# Patient Record
Sex: Male | Born: 1940 | Race: Black or African American | Hispanic: No | Marital: Married | Smoking: Former smoker
Health system: Southern US, Community
[De-identification: ages and names within clinical notes are randomized; demographics above are authoritative.]

## PROBLEM LIST (undated history)

## (undated) DIAGNOSIS — F32A Depression, unspecified: Secondary | ICD-10-CM

## (undated) DIAGNOSIS — Z8719 Personal history of other diseases of the digestive system: Secondary | ICD-10-CM

## (undated) DIAGNOSIS — E119 Type 2 diabetes mellitus without complications: Secondary | ICD-10-CM

## (undated) DIAGNOSIS — IMO0001 Reserved for inherently not codable concepts without codable children: Secondary | ICD-10-CM

## (undated) DIAGNOSIS — M1990 Unspecified osteoarthritis, unspecified site: Secondary | ICD-10-CM

## (undated) DIAGNOSIS — F419 Anxiety disorder, unspecified: Secondary | ICD-10-CM

## (undated) DIAGNOSIS — I10 Essential (primary) hypertension: Secondary | ICD-10-CM

## (undated) DIAGNOSIS — F4310 Post-traumatic stress disorder, unspecified: Secondary | ICD-10-CM

## (undated) DIAGNOSIS — I519 Heart disease, unspecified: Secondary | ICD-10-CM

## (undated) DIAGNOSIS — F329 Major depressive disorder, single episode, unspecified: Secondary | ICD-10-CM

## (undated) DIAGNOSIS — K219 Gastro-esophageal reflux disease without esophagitis: Secondary | ICD-10-CM

## (undated) DIAGNOSIS — E1140 Type 2 diabetes mellitus with diabetic neuropathy, unspecified: Secondary | ICD-10-CM

## (undated) HISTORY — PX: HEMORROIDECTOMY: SUR656

## (undated) HISTORY — PX: CHOLECYSTECTOMY: SHX55

---

## 2013-11-08 ENCOUNTER — Encounter (HOSPITAL_COMMUNITY): Payer: Self-pay | Admitting: *Deleted

## 2013-11-14 ENCOUNTER — Other Ambulatory Visit: Payer: Self-pay | Admitting: Gastroenterology

## 2013-11-14 NOTE — Anesthesia Preprocedure Evaluation (Addendum)
Anesthesia Evaluation  Patient identified by MRN, date of birth, ID band Patient awake    Reviewed: Allergy & Precautions, H&P , NPO status , Patient's Chart, lab work & pertinent test results  Airway Mallampati: II  TM Distance: >3 FB Neck ROM: Full    Dental no notable dental hx.    Pulmonary former smoker,  breath sounds clear to auscultation  Pulmonary exam normal       Cardiovascular hypertension, Pt. on medications and Pt. on home beta blockers Rhythm:Regular Rate:Normal     Neuro/Psych PSYCHIATRIC DISORDERS Anxiety Depression negative neurological ROS     GI/Hepatic Neg liver ROS, GERD-  Medicated and Controlled,  Endo/Other  negative endocrine ROSdiabetes  Renal/GU negative Renal ROS  negative genitourinary   Musculoskeletal negative musculoskeletal ROS (+)   Abdominal   Peds negative pediatric ROS (+)  Hematology negative hematology ROS (+)   Anesthesia Other Findings   Reproductive/Obstetrics negative OB ROS                            Anesthesia Physical Anesthesia Plan  ASA: II  Anesthesia Plan: MAC   Post-op Pain Management:    Induction: Intravenous  Airway Management Planned: Nasal Cannula  Additional Equipment:   Intra-op Plan:   Post-operative Plan: Extubation in OR  Informed Consent: I have reviewed the patients History and Physical, chart, labs and discussed the procedure including the risks, benefits and alternatives for the proposed anesthesia with the patient or authorized representative who has indicated his/her understanding and acceptance.   Dental advisory given  Plan Discussed with: CRNA  Anesthesia Plan Comments:         Anesthesia Quick Evaluation

## 2013-11-15 ENCOUNTER — Ambulatory Visit (HOSPITAL_COMMUNITY): Payer: Non-veteran care | Admitting: Anesthesiology

## 2013-11-15 ENCOUNTER — Encounter (HOSPITAL_COMMUNITY): Payer: Self-pay

## 2013-11-15 ENCOUNTER — Encounter (HOSPITAL_COMMUNITY)
Admission: RE | Disposition: A | Discharge status: Home / Self Care | Payer: Self-pay | Source: Ambulatory Visit | Attending: Gastroenterology

## 2013-11-15 ENCOUNTER — Ambulatory Visit (HOSPITAL_COMMUNITY)
Admission: RE | Admit: 2013-11-15 | Discharge: 2013-11-15 | Disposition: A | Discharge status: Home / Self Care | Payer: Non-veteran care | Source: Ambulatory Visit | Attending: Gastroenterology | Admitting: Gastroenterology

## 2013-11-15 DIAGNOSIS — D131 Benign neoplasm of stomach: Secondary | ICD-10-CM | POA: Diagnosis not present

## 2013-11-15 DIAGNOSIS — K319 Disease of stomach and duodenum, unspecified: Secondary | ICD-10-CM | POA: Diagnosis present

## 2013-11-15 HISTORY — PX: EUS: SHX5427

## 2013-11-15 HISTORY — DX: Personal history of other diseases of the digestive system: Z87.19

## 2013-11-15 HISTORY — PX: FINE NEEDLE ASPIRATION: SHX5430

## 2013-11-15 HISTORY — DX: Type 2 diabetes mellitus without complications: E11.9

## 2013-11-15 HISTORY — DX: Anxiety disorder, unspecified: F41.9

## 2013-11-15 HISTORY — DX: Heart disease, unspecified: I51.9

## 2013-11-15 HISTORY — DX: Post-traumatic stress disorder, unspecified: F43.10

## 2013-11-15 HISTORY — DX: Reserved for inherently not codable concepts without codable children: IMO0001

## 2013-11-15 HISTORY — DX: Essential (primary) hypertension: I10

## 2013-11-15 HISTORY — DX: Major depressive disorder, single episode, unspecified: F32.9

## 2013-11-15 HISTORY — DX: Gastro-esophageal reflux disease without esophagitis: K21.9

## 2013-11-15 HISTORY — DX: Depression, unspecified: F32.A

## 2013-11-15 HISTORY — DX: Unspecified osteoarthritis, unspecified site: M19.90

## 2013-11-15 LAB — GLUCOSE, CAPILLARY: Glucose-Capillary: 81 mg/dL (ref 70–99)

## 2013-11-15 SURGERY — ESOPHAGEAL ENDOSCOPIC ULTRASOUND (EUS) RADIAL
Anesthesia: Monitor Anesthesia Care

## 2013-11-15 MED ORDER — SODIUM CHLORIDE 0.9 % IV SOLN: INTRAVENOUS | Status: DC

## 2013-11-15 MED ORDER — PROPOFOL 10 MG/ML IV BOLUS
INTRAVENOUS | Status: DC | PRN
  Administered 2013-11-15: 50 mg via INTRAVENOUS

## 2013-11-15 MED ORDER — LIDOCAINE HCL (CARDIAC) 20 MG/ML IV SOLN
INTRAVENOUS | Status: AC
  Filled 2013-11-15: qty 5

## 2013-11-15 MED ORDER — PROPOFOL 10 MG/ML IV BOLUS
INTRAVENOUS | Status: AC
  Filled 2013-11-15: qty 20

## 2013-11-15 MED ORDER — LACTATED RINGERS IV SOLN
INTRAVENOUS | Status: DC
  Administered 2013-11-15: 1000 mL via INTRAVENOUS

## 2013-11-15 MED ORDER — PROPOFOL INFUSION 10 MG/ML OPTIME
INTRAVENOUS | Status: DC | PRN
  Administered 2013-11-15: 140 ug/kg/min via INTRAVENOUS

## 2013-11-15 MED ORDER — LIDOCAINE HCL (CARDIAC) 20 MG/ML IV SOLN
INTRAVENOUS | Status: DC | PRN
  Administered 2013-11-15: 50 mg via INTRAVENOUS

## 2013-11-15 MED ORDER — LACTATED RINGERS IV SOLN
INTRAVENOUS | Status: DC | PRN
  Administered 2013-11-15: 12:00:00 via INTRAVENOUS

## 2013-11-15 NOTE — Addendum Note (Signed)
Addended by: Arta Silence on: 11/15/2013 09:29 AM   Modules accepted: Orders

## 2013-11-15 NOTE — Anesthesia Postprocedure Evaluation (Signed)
  Anesthesia Post-op Note  Patient: Gregory Mueller  Procedure(s) Performed: Procedure(s) (LRB): ESOPHAGEAL ENDOSCOPIC ULTRASOUND (EUS) RADIAL (N/A) FINE NEEDLE ASPIRATION (FNA) RADIAL (N/A)  Patient Location: PACU  Anesthesia Type: MAC  Level of Consciousness: awake and alert   Airway and Oxygen Therapy: Patient Spontanous Breathing  Post-op Pain: mild  Post-op Assessment: Post-op Vital signs reviewed, Patient's Cardiovascular Status Stable, Respiratory Function Stable, Patent Airway and No signs of Nausea or vomiting  Last Vitals:  Filed Vitals:   11/15/13 1320  BP: 148/66  Pulse: 43  Temp:   Resp: 13    Post-op Vital Signs: stable   Complications: No apparent anesthesia complications

## 2013-11-15 NOTE — Op Note (Signed)
[facility] [place], [id number]   ENDOSCOPIC ULTRASOUND PROCEDURE REPORT  PATIENT: Gregory Mueller, Gregory Mueller  MR#: [id number] BIRTHDATE: 12/31/1940  GENDER: male ENDOSCOPIST: Arta Silence, MD REFERRED BY:  [facility] PROCEDURE DATE:  11/15/2013 PROCEDURE:   Upper EUS ASA CLASS:      Class II INDICATIONS:   1.  gastric nodule. MEDICATIONS: Monitored anesthesia care  DESCRIPTION OF PROCEDURE:   After the risks benefits and alternatives of the procedure were  explained, informed consent was obtained. The patient was then placed in the left, lateral, decubitus postion and IV sedation was administered. Throughout the procedure, the patients blood pressure, pulse and oxygen saturations were monitored continuously.  Under direct visualization, the     endoscope was introduced through the mouth and advanced to the second portion of the duodenum .  Water was used as necessary to provide an acoustic interface.  Upon completion of the imaging, water was removed and the patient was sent to the recovery room in satisfactory condition.    FINDINGS:      EGD:  Normal esophagus.  Few very small gastric polyps.  In proximal stomach, along lesser curvature of the cardia, a very small submucosal-appearing nodule was seen with normal overlying mucosa.  Mild antral gastritis.  Endoscopy otherwise normal to the second portion of the duodenum. EUS:  Small 7mm x 36mm hypoechoic nodule seen arising from the muscularis propria along the proximal stomach, directly apposing the liver.  No other abnormalities seen within the gastric wall. No perigastric adenopathy.  No abnormal gastric wall layers identified.  IMPRESSION:     As above.  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  RECOMMENDATIONS:     1.  Watch for potential complications of procedure. 2.  Given very small size and indolent appearance on endoscopic ultrasound, could consider repeat EGD +/- EUS in a  couple years. Unless lesion approaches 2cm in size, doubt there is any utility in obtaining biopsies or considering surgical resection. 3.  Follow-up with [facility] on as-needed basis.   _______________________________ Lorrin MaisArta Silence, MD 11/15/2013 1:00 PM   CC:

## 2013-11-15 NOTE — Transfer of Care (Signed)
Immediate Anesthesia Transfer of Care Note  Patient: Gregory Mueller  Procedure(s) Performed: Procedure(s): ESOPHAGEAL ENDOSCOPIC ULTRASOUND (EUS) RADIAL (N/A) FINE NEEDLE ASPIRATION (FNA) RADIAL (N/A)  Patient Location: PACU  Anesthesia Type:MAC  Level of Consciousness: awake, alert  and oriented  Airway & Oxygen Therapy: Patient Spontanous Breathing and Patient connected to nasal cannula oxygen  Post-op Assessment: Report given to PACU RN and Post -op Vital signs reviewed and stable  Post vital signs: stable  Complications: No apparent anesthesia complications

## 2013-11-15 NOTE — H&P (Signed)
Patient interval history reviewed.  Patient examined again.  There has been no change from documented H/P dated 11/07/13 (scanned into chart from our office) except as documented above.  Assessment:  1.  Gastric nodule.  Plan:  1.  Upper endoscopic ultrasound with possible fine needle aspiration biopsies. 2.  Risks (bleeding, infection, bowel perforation that could require surgery, sedation-related changes in cardiopulmonary systems), benefits (identification and possible treatment of source of symptoms, exclusion of certain causes of symptoms), and alternatives (watchful waiting, radiographic imaging studies, empiric medical treatment) of upper endoscopy with ultrasound and possible biopsies (EUS +/- FNA) were explained to patient/family in detail and patient wishes to proceed.

## 2013-11-16 ENCOUNTER — Encounter (HOSPITAL_COMMUNITY): Payer: Self-pay | Admitting: Gastroenterology

## 2015-02-27 DIAGNOSIS — K219 Gastro-esophageal reflux disease without esophagitis: Secondary | ICD-10-CM | POA: Diagnosis not present

## 2015-03-06 DIAGNOSIS — M9903 Segmental and somatic dysfunction of lumbar region: Secondary | ICD-10-CM | POA: Diagnosis not present

## 2015-03-06 DIAGNOSIS — M5442 Lumbago with sciatica, left side: Secondary | ICD-10-CM | POA: Diagnosis not present

## 2015-03-08 DIAGNOSIS — M9903 Segmental and somatic dysfunction of lumbar region: Secondary | ICD-10-CM | POA: Diagnosis not present

## 2015-03-08 DIAGNOSIS — M5442 Lumbago with sciatica, left side: Secondary | ICD-10-CM | POA: Diagnosis not present

## 2015-03-11 DIAGNOSIS — K219 Gastro-esophageal reflux disease without esophagitis: Secondary | ICD-10-CM | POA: Diagnosis not present

## 2015-03-11 DIAGNOSIS — Z1211 Encounter for screening for malignant neoplasm of colon: Secondary | ICD-10-CM | POA: Diagnosis not present

## 2015-03-11 DIAGNOSIS — R1310 Dysphagia, unspecified: Secondary | ICD-10-CM | POA: Diagnosis not present

## 2015-03-11 DIAGNOSIS — K5900 Constipation, unspecified: Secondary | ICD-10-CM | POA: Diagnosis not present

## 2015-03-12 DIAGNOSIS — K2970 Gastritis, unspecified, without bleeding: Secondary | ICD-10-CM | POA: Diagnosis not present

## 2015-03-12 DIAGNOSIS — K5900 Constipation, unspecified: Secondary | ICD-10-CM | POA: Diagnosis not present

## 2015-03-12 DIAGNOSIS — R1310 Dysphagia, unspecified: Secondary | ICD-10-CM | POA: Diagnosis not present

## 2015-03-12 DIAGNOSIS — K209 Esophagitis, unspecified: Secondary | ICD-10-CM | POA: Diagnosis not present

## 2015-03-15 DIAGNOSIS — R1030 Lower abdominal pain, unspecified: Secondary | ICD-10-CM | POA: Diagnosis not present

## 2015-03-20 DIAGNOSIS — R1310 Dysphagia, unspecified: Secondary | ICD-10-CM | POA: Diagnosis not present

## 2015-03-20 DIAGNOSIS — K219 Gastro-esophageal reflux disease without esophagitis: Secondary | ICD-10-CM | POA: Diagnosis not present

## 2015-03-20 DIAGNOSIS — K5731 Diverticulosis of large intestine without perforation or abscess with bleeding: Secondary | ICD-10-CM | POA: Diagnosis not present

## 2015-03-20 DIAGNOSIS — K2970 Gastritis, unspecified, without bleeding: Secondary | ICD-10-CM | POA: Diagnosis not present

## 2015-10-04 DIAGNOSIS — M9901 Segmental and somatic dysfunction of cervical region: Secondary | ICD-10-CM | POA: Diagnosis not present

## 2015-10-04 DIAGNOSIS — M47812 Spondylosis without myelopathy or radiculopathy, cervical region: Secondary | ICD-10-CM | POA: Diagnosis not present

## 2017-03-22 ENCOUNTER — Other Ambulatory Visit: Payer: Self-pay

## 2017-03-22 ENCOUNTER — Encounter (HOSPITAL_COMMUNITY): Payer: Self-pay | Admitting: *Deleted

## 2017-03-22 ENCOUNTER — Emergency Department (HOSPITAL_COMMUNITY)
Admission: EM | Admit: 2017-03-22 | Discharge: 2017-03-22 | Disposition: A | Discharge status: Home / Self Care | Payer: Non-veteran care | Attending: Emergency Medicine | Admitting: Emergency Medicine

## 2017-03-22 ENCOUNTER — Emergency Department (HOSPITAL_COMMUNITY): Payer: Non-veteran care

## 2017-03-22 DIAGNOSIS — G4453 Primary thunderclap headache: Secondary | ICD-10-CM | POA: Diagnosis not present

## 2017-03-22 DIAGNOSIS — E237 Disorder of pituitary gland, unspecified: Secondary | ICD-10-CM | POA: Diagnosis not present

## 2017-03-22 DIAGNOSIS — Z79899 Other long term (current) drug therapy: Secondary | ICD-10-CM | POA: Diagnosis not present

## 2017-03-22 DIAGNOSIS — R51 Headache: Secondary | ICD-10-CM | POA: Diagnosis present

## 2017-03-22 DIAGNOSIS — G44209 Tension-type headache, unspecified, not intractable: Secondary | ICD-10-CM | POA: Diagnosis not present

## 2017-03-22 DIAGNOSIS — E236 Other disorders of pituitary gland: Secondary | ICD-10-CM

## 2017-03-22 DIAGNOSIS — E119 Type 2 diabetes mellitus without complications: Secondary | ICD-10-CM | POA: Insufficient documentation

## 2017-03-22 DIAGNOSIS — Z7982 Long term (current) use of aspirin: Secondary | ICD-10-CM | POA: Insufficient documentation

## 2017-03-22 DIAGNOSIS — I10 Essential (primary) hypertension: Secondary | ICD-10-CM | POA: Diagnosis not present

## 2017-03-22 DIAGNOSIS — Z87891 Personal history of nicotine dependence: Secondary | ICD-10-CM | POA: Diagnosis not present

## 2017-03-22 HISTORY — DX: Type 2 diabetes mellitus with diabetic neuropathy, unspecified: E11.40

## 2017-03-22 LAB — COMPREHENSIVE METABOLIC PANEL
ALT: 8 U/L — ABNORMAL LOW (ref 17–63)
AST: 20 U/L (ref 15–41)
Albumin: 3.5 g/dL (ref 3.5–5.0)
Alkaline Phosphatase: 58 U/L (ref 38–126)
Anion gap: 7 (ref 5–15)
BILIRUBIN TOTAL: 0.5 mg/dL (ref 0.3–1.2)
BUN: 11 mg/dL (ref 6–20)
CO2: 23 mmol/L (ref 22–32)
Calcium: 9.4 mg/dL (ref 8.9–10.3)
Chloride: 107 mmol/L (ref 101–111)
Creatinine, Ser: 1.13 mg/dL (ref 0.61–1.24)
GFR calc Af Amer: >60 mL/min (ref >60)
GFR calc non Af Amer: >60 mL/min (ref >60)
GLUCOSE: 91 mg/dL (ref 65–99)
POTASSIUM: 4.2 mmol/L (ref 3.5–5.1)
Sodium: 137 mmol/L (ref 135–145)
TOTAL PROTEIN: 6.4 g/dL — AB (ref 6.5–8.1)

## 2017-03-22 LAB — CBC
HCT: 34.5 % — ABNORMAL LOW (ref 39.0–52.0)
Hemoglobin: 11.3 g/dL — ABNORMAL LOW (ref 13.0–17.0)
MCH: 28.5 pg (ref 26.0–34.0)
MCHC: 32.8 g/dL (ref 30.0–36.0)
MCV: 87.1 fL (ref 78.0–100.0)
Platelets: DECREASED 10*3/uL (ref 150–400)
RBC: 3.96 MIL/uL — ABNORMAL LOW (ref 4.22–5.81)
RDW: 16.8 % — AB (ref 11.5–15.5)
WBC: 6.5 10*3/uL (ref 4.0–10.5)

## 2017-03-22 LAB — PLATELET COUNT: PLATELETS: 144 10*3/uL — AB (ref 150–400)

## 2017-03-22 MED ORDER — SODIUM CHLORIDE 0.9 % IV BOLUS (SEPSIS)
500.0000 mL | Freq: Once | INTRAVENOUS | Status: AC
  Administered 2017-03-22: 500 mL via INTRAVENOUS

## 2017-03-22 MED ORDER — OXYCODONE-ACETAMINOPHEN 5-325 MG PO TABS: 1.0000 | ORAL_TABLET | Freq: Once | ORAL | Status: DC

## 2017-03-22 MED ORDER — DIAZEPAM 5 MG/ML IJ SOLN
5.0000 mg | Freq: Once | INTRAMUSCULAR | Status: AC
  Administered 2017-03-22: 5 mg via INTRAVENOUS
  Filled 2017-03-22: qty 2

## 2017-03-22 MED ORDER — MORPHINE SULFATE (PF) 4 MG/ML IV SOLN
4.0000 mg | Freq: Once | INTRAVENOUS | Status: AC
  Administered 2017-03-22: 4 mg via INTRAVENOUS
  Filled 2017-03-22: qty 1

## 2017-03-22 MED ORDER — PROCHLORPERAZINE EDISYLATE 5 MG/ML IJ SOLN
10.0000 mg | Freq: Once | INTRAMUSCULAR | Status: AC
  Administered 2017-03-22: 10 mg via INTRAVENOUS
  Filled 2017-03-22: qty 2

## 2017-03-22 MED ORDER — KETOROLAC TROMETHAMINE 15 MG/ML IJ SOLN: 15.0000 mg | Freq: Once | INTRAMUSCULAR | Status: DC

## 2017-03-22 NOTE — ED Notes (Signed)
Pt returned from imaging.

## 2017-03-22 NOTE — ED Notes (Signed)
Report pain in head and neck. He reports that he was given pain Medicine at [facility] which helped but back in pain

## 2017-03-22 NOTE — ED Notes (Signed)
Patient's wife reports that they were at the [place] on Saturday, then on Sunday, they went to another hospital. She states that they were told that he has a "Tumor and needs to get an MRI".  EDP at bedside

## 2017-03-22 NOTE — ED Triage Notes (Signed)
Pt was tx at [facility] last night and dx with a tension headache.  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  Pt is not nauseated, not photophobic and is afebrile.  Pt was given a narcotic at the hospital, but it has worn off and the pain is unbearable.

## 2017-03-22 NOTE — ED Notes (Signed)
ED Provider at bedside. 

## 2017-03-22 NOTE — ED Provider Notes (Signed)
Care assumed from previous provider PA Wetzel. Please see note for further details. Case discussed, plan agreed upon. Briefly, patient is a 77 y.o. male who presents to ED for persistent headache over the last 5 days. Seen at [facility] yesterday where CT head was obtained showing pituitary mass. Per previous provider, he was informed to follow up for MRI for further evaluation. MRI ordered in ED today and pending at shift change. Will follow up on results and disposition accordingly. If no acute findings, likely can follow up as outpatient.   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    Patient reevaluated.  He has not had any visual changes.  Headache has improved with medications here in ED.  Evaluation does not show pathology that would require ongoing emergent intervention or inpatient treatment. He does have a prescription for pain medication from ER visit yesterday.  I discussed MRI findings with patient and family as well as the importance of neurology follow-up which was provided today.  We discussed reasons to return to the emergency department and symptomatic home care instructions.  All questions were answered.  Patient discussed with Dr. Wilson Singer who agrees with treatment plan.     Illyana Schorsch, Ozella Almond, PA-C 03/22/17 2154    Virgel Manifold, MD 03/23/17 [phone]

## 2017-03-22 NOTE — ED Notes (Signed)
Patient transported to MRI 
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
## 2017-03-22 NOTE — ED Notes (Signed)
Family remains at bedside.

## 2017-03-22 NOTE — Discharge Instructions (Signed)
Please call the neurology clinic listed in the morning to schedule a follow up appointment.   Return to ER for fevers, visual changes, new or worsening symptoms, any additional concerns.

## 2017-03-22 NOTE — ED Notes (Signed)
Iv team at the bedsdie

## 2017-03-22 NOTE — ED Notes (Signed)
Pt still in MRI, family in room awaiting pt return.

## 2017-03-22 NOTE — ED Notes (Signed)
Per lab, metabolic panel grossly hemolyzed. Will recollect.

## 2017-03-22 NOTE — ED Notes (Signed)
Pt in gown and on monitor 

## 2017-03-22 NOTE — ED Notes (Signed)
Patient verbalizes understanding of discharge instructions. Opportunity for questioning and answers were provided. Armband removed by staff, pt discharged from ED via wheelchair.  

## 2017-03-22 NOTE — ED Notes (Signed)
Pt remains in MRI 

## 2017-03-24 ENCOUNTER — Telehealth: Payer: Self-pay | Admitting: Diagnostic Neuroimaging

## 2017-03-24 ENCOUNTER — Encounter: Payer: Self-pay | Admitting: Diagnostic Neuroimaging

## 2017-03-24 ENCOUNTER — Ambulatory Visit (INDEPENDENT_AMBULATORY_CARE_PROVIDER_SITE_OTHER): Payer: Medicare Other | Admitting: Diagnostic Neuroimaging

## 2017-03-24 VITALS — BP 128/81 | HR 62 | Ht 68.0 in | Wt 219.2 lb

## 2017-03-24 DIAGNOSIS — G4489 Other headache syndrome: Secondary | ICD-10-CM | POA: Diagnosis not present

## 2017-03-24 DIAGNOSIS — D352 Benign neoplasm of pituitary gland: Secondary | ICD-10-CM

## 2017-03-24 NOTE — Telephone Encounter (Signed)
Noted  
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
## 2017-03-24 NOTE — Telephone Encounter (Signed)
Called and Left message for [facility] . Relayed I had Urgent referral waiting for a returned telephone call. Neurosurgery [phone] ext [phone]. Endocrinology [phone] ext [phone]. Ophthalmology [phone] ext [phone]    I called and explained to patient that I had called and left a message and explained I would do my best to get him in with [facility] as quick as I could . Patient relayed if they had not called me back buy Monday 03/29/2017. To schedule him with [place] neuro surgery .  Endocrinology and Ophthalmology apt in [place].

## 2017-03-29 NOTE — Telephone Encounter (Signed)
Spoke to Patient his referral has been sent to [facility] Neurosurgery.

## 2017-03-29 NOTE — Telephone Encounter (Signed)
Pt called back, he is wanting to know the status of the referrals. Please call to advise

## 2017-03-30 NOTE — Telephone Encounter (Addendum)
Patient has his Ophthalmology apt 04/01/2017 arrive at 9:30 for 10:00 apt at [facility] center.

## 2017-03-31 DIAGNOSIS — D352 Benign neoplasm of pituitary gland: Secondary | ICD-10-CM | POA: Diagnosis not present

## 2017-03-31 DIAGNOSIS — Z6833 Body mass index (BMI) 33.0-33.9, adult: Secondary | ICD-10-CM | POA: Diagnosis not present

## 2017-03-31 DIAGNOSIS — I10 Essential (primary) hypertension: Secondary | ICD-10-CM | POA: Diagnosis not present

## 2017-03-31 NOTE — Telephone Encounter (Signed)
Patient is scheduled with Neurosurgery 04/16/2017 Rollene Fare stated if they get Cx she will call Patient first. Patient is aware I have talked to him.

## 2017-04-01 DIAGNOSIS — E237 Disorder of pituitary gland, unspecified: Secondary | ICD-10-CM | POA: Diagnosis not present

## 2017-04-09 ENCOUNTER — Ambulatory Visit: Payer: Medicare Other | Admitting: "Endocrinology

## 2017-04-13 DIAGNOSIS — D352 Benign neoplasm of pituitary gland: Secondary | ICD-10-CM | POA: Diagnosis not present

## 2017-04-19 DIAGNOSIS — I10 Essential (primary) hypertension: Secondary | ICD-10-CM | POA: Diagnosis not present

## 2017-04-19 DIAGNOSIS — D352 Benign neoplasm of pituitary gland: Secondary | ICD-10-CM | POA: Diagnosis not present

## 2017-04-19 DIAGNOSIS — Z6837 Body mass index (BMI) 37.0-37.9, adult: Secondary | ICD-10-CM | POA: Diagnosis not present

## 2017-08-11 DIAGNOSIS — I10 Essential (primary) hypertension: Secondary | ICD-10-CM | POA: Diagnosis not present

## 2017-08-11 DIAGNOSIS — F4310 Post-traumatic stress disorder, unspecified: Secondary | ICD-10-CM | POA: Diagnosis not present

## 2017-08-11 DIAGNOSIS — E2740 Unspecified adrenocortical insufficiency: Secondary | ICD-10-CM | POA: Diagnosis not present

## 2017-08-11 DIAGNOSIS — N400 Enlarged prostate without lower urinary tract symptoms: Secondary | ICD-10-CM | POA: Diagnosis not present

## 2017-08-11 DIAGNOSIS — D352 Benign neoplasm of pituitary gland: Secondary | ICD-10-CM | POA: Diagnosis not present

## 2017-08-11 DIAGNOSIS — E230 Hypopituitarism: Secondary | ICD-10-CM | POA: Diagnosis not present

## 2017-08-12 DIAGNOSIS — E039 Hypothyroidism, unspecified: Secondary | ICD-10-CM | POA: Diagnosis present

## 2017-08-12 DIAGNOSIS — Z87891 Personal history of nicotine dependence: Secondary | ICD-10-CM | POA: Diagnosis not present

## 2017-08-12 DIAGNOSIS — N400 Enlarged prostate without lower urinary tract symptoms: Secondary | ICD-10-CM | POA: Diagnosis present

## 2017-08-12 DIAGNOSIS — F4310 Post-traumatic stress disorder, unspecified: Secondary | ICD-10-CM | POA: Diagnosis present

## 2017-08-12 DIAGNOSIS — Z7982 Long term (current) use of aspirin: Secondary | ICD-10-CM | POA: Diagnosis not present

## 2017-08-12 DIAGNOSIS — Z885 Allergy status to narcotic agent status: Secondary | ICD-10-CM | POA: Diagnosis not present

## 2017-08-12 DIAGNOSIS — D352 Benign neoplasm of pituitary gland: Secondary | ICD-10-CM | POA: Diagnosis present

## 2017-08-12 DIAGNOSIS — E1140 Type 2 diabetes mellitus with diabetic neuropathy, unspecified: Secondary | ICD-10-CM | POA: Diagnosis present

## 2017-08-12 DIAGNOSIS — Z79899 Other long term (current) drug therapy: Secondary | ICD-10-CM | POA: Diagnosis not present

## 2017-08-12 DIAGNOSIS — E2740 Unspecified adrenocortical insufficiency: Secondary | ICD-10-CM | POA: Diagnosis present

## 2017-08-12 DIAGNOSIS — I10 Essential (primary) hypertension: Secondary | ICD-10-CM | POA: Diagnosis present

## 2017-08-12 DIAGNOSIS — E230 Hypopituitarism: Secondary | ICD-10-CM | POA: Diagnosis present

## 2017-09-13 DIAGNOSIS — E230 Hypopituitarism: Secondary | ICD-10-CM | POA: Diagnosis not present

## 2017-09-13 DIAGNOSIS — E291 Testicular hypofunction: Secondary | ICD-10-CM | POA: Diagnosis not present

## 2017-09-13 DIAGNOSIS — E559 Vitamin D deficiency, unspecified: Secondary | ICD-10-CM | POA: Diagnosis not present

## 2017-09-13 DIAGNOSIS — E039 Hypothyroidism, unspecified: Secondary | ICD-10-CM | POA: Diagnosis not present

## 2017-09-13 DIAGNOSIS — E1165 Type 2 diabetes mellitus with hyperglycemia: Secondary | ICD-10-CM | POA: Diagnosis not present

## 2017-09-13 DIAGNOSIS — E2740 Unspecified adrenocortical insufficiency: Secondary | ICD-10-CM | POA: Diagnosis not present

## 2017-09-13 DIAGNOSIS — D352 Benign neoplasm of pituitary gland: Secondary | ICD-10-CM | POA: Diagnosis not present

## 2017-10-26 DIAGNOSIS — E039 Hypothyroidism, unspecified: Secondary | ICD-10-CM | POA: Diagnosis not present

## 2017-10-26 DIAGNOSIS — E559 Vitamin D deficiency, unspecified: Secondary | ICD-10-CM | POA: Diagnosis not present

## 2017-10-26 DIAGNOSIS — E291 Testicular hypofunction: Secondary | ICD-10-CM | POA: Diagnosis not present

## 2017-10-26 DIAGNOSIS — E230 Hypopituitarism: Secondary | ICD-10-CM | POA: Diagnosis not present

## 2017-10-26 DIAGNOSIS — E1165 Type 2 diabetes mellitus with hyperglycemia: Secondary | ICD-10-CM | POA: Diagnosis not present

## 2017-12-27 DIAGNOSIS — E291 Testicular hypofunction: Secondary | ICD-10-CM | POA: Diagnosis not present

## 2017-12-27 DIAGNOSIS — E039 Hypothyroidism, unspecified: Secondary | ICD-10-CM | POA: Diagnosis not present

## 2017-12-27 DIAGNOSIS — E1165 Type 2 diabetes mellitus with hyperglycemia: Secondary | ICD-10-CM | POA: Diagnosis not present

## 2017-12-27 DIAGNOSIS — E559 Vitamin D deficiency, unspecified: Secondary | ICD-10-CM | POA: Diagnosis not present

## 2017-12-27 DIAGNOSIS — E230 Hypopituitarism: Secondary | ICD-10-CM | POA: Diagnosis not present

## 2018-06-06 DIAGNOSIS — T1501XA Foreign body in cornea, right eye, initial encounter: Secondary | ICD-10-CM | POA: Diagnosis not present

## 2018-06-13 ENCOUNTER — Other Ambulatory Visit: Payer: Self-pay

## 2018-06-13 ENCOUNTER — Ambulatory Visit (INDEPENDENT_AMBULATORY_CARE_PROVIDER_SITE_OTHER): Payer: Medicare Other | Admitting: "Endocrinology

## 2018-06-13 ENCOUNTER — Encounter (INDEPENDENT_AMBULATORY_CARE_PROVIDER_SITE_OTHER): Payer: Self-pay

## 2018-06-13 ENCOUNTER — Encounter: Payer: Self-pay | Admitting: "Endocrinology

## 2018-06-13 VITALS — BP 144/78 | HR 54 | Ht 68.0 in | Wt 214.0 lb

## 2018-06-13 DIAGNOSIS — E039 Hypothyroidism, unspecified: Secondary | ICD-10-CM

## 2018-06-13 DIAGNOSIS — E236 Other disorders of pituitary gland: Secondary | ICD-10-CM | POA: Diagnosis not present
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
## 2018-06-23 DIAGNOSIS — E236 Other disorders of pituitary gland: Secondary | ICD-10-CM | POA: Diagnosis not present

## 2018-06-24 ENCOUNTER — Other Ambulatory Visit: Payer: Self-pay | Admitting: "Endocrinology

## 2018-06-24 LAB — PROLACTIN: Prolactin: 10.8 ng/mL (ref 2.0–18.0)

## 2018-06-24 LAB — CORTISOL-AM, BLOOD: Cortisol - AM: 4.3 ug/dL

## 2018-06-24 LAB — TSH: TSH: 2.07 mIU/L (ref 0.40–4.50)

## 2018-06-24 LAB — T4, FREE: Free T4: 0.6 ng/dL — ABNORMAL LOW (ref 0.8–1.8)

## 2018-06-24 MED ORDER — LEVOTHYROXINE SODIUM 50 MCG PO TABS: 50.0000 ug | ORAL_TABLET | Freq: Every day | ORAL | 6 refills | Status: DC

## 2018-06-27 NOTE — Progress Notes (Signed)
Patient is aware of the recommendation, he states the new Rx should be sent to the [place]

## 2018-07-04 ENCOUNTER — Encounter: Payer: Self-pay | Admitting: "Endocrinology

## 2018-07-04 ENCOUNTER — Ambulatory Visit (INDEPENDENT_AMBULATORY_CARE_PROVIDER_SITE_OTHER): Payer: Medicare Other | Admitting: "Endocrinology

## 2018-07-04 ENCOUNTER — Other Ambulatory Visit: Payer: Self-pay

## 2018-07-04 VITALS — BP 127/78 | HR 64 | Ht 68.0 in | Wt 212.0 lb

## 2018-07-04 DIAGNOSIS — E039 Hypothyroidism, unspecified: Secondary | ICD-10-CM | POA: Diagnosis not present

## 2018-07-04 DIAGNOSIS — E236 Other disorders of pituitary gland: Secondary | ICD-10-CM
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
## 2018-07-05 ENCOUNTER — Telehealth: Payer: Self-pay | Admitting: "Endocrinology

## 2018-07-05 NOTE — Telephone Encounter (Signed)
Pt called back because he states he told us yesterday that he takes 2 47mcg tablets of Levothyroxine. He states that he actually takes 2 79mcg tablets.

## 2018-07-05 NOTE — Telephone Encounter (Signed)
I want him to continue 2 25 mcg tabs ( his next prescription is 50 mcg one daily).

## 2018-09-09 ENCOUNTER — Telehealth: Payer: Self-pay

## 2018-09-09 DIAGNOSIS — E039 Hypothyroidism, unspecified: Secondary | ICD-10-CM

## 2018-09-09 MED ORDER — LEVOTHYROXINE SODIUM 25 MCG PO TABS: 50.0000 ug | ORAL_TABLET | Freq: Every day | ORAL | 0 refills | Status: DC

## 2018-09-09 NOTE — Telephone Encounter (Signed)
Gregory Mueller, CMA  

## 2018-09-29 DIAGNOSIS — E039 Hypothyroidism, unspecified: Secondary | ICD-10-CM | POA: Diagnosis not present

## 2018-09-29 DIAGNOSIS — E236 Other disorders of pituitary gland: Secondary | ICD-10-CM | POA: Diagnosis not present

## 2018-09-30 LAB — CBC WITH DIFFERENTIAL/PLATELET
Absolute Monocytes: 390 cells/uL (ref 200–950)
Basophils Absolute: 42 cells/uL (ref 0–200)
Basophils Relative: 0.9 %
Eosinophils Absolute: 150 cells/uL (ref 15–500)
Eosinophils Relative: 3.2 %
HCT: 37.4 % — ABNORMAL LOW (ref 38.5–50.0)
Hemoglobin: 11.9 g/dL — ABNORMAL LOW (ref 13.2–17.1)
Lymphs Abs: 2552 cells/uL (ref 850–3900)
MCH: 28.7 pg (ref 27.0–33.0)
MCHC: 31.8 g/dL — ABNORMAL LOW (ref 32.0–36.0)
MCV: 90.3 fL (ref 80.0–100.0)
MPV: 11.1 fL (ref 7.5–12.5)
Monocytes Relative: 8.3 %
Neutro Abs: 1565 cells/uL (ref 1500–7800)
Neutrophils Relative %: 33.3 %
Platelets: 148 10*3/uL (ref 140–400)
RBC: 4.14 10*6/uL — ABNORMAL LOW (ref 4.20–5.80)
RDW: 13.1 % (ref 11.0–15.0)
Total Lymphocyte: 54.3 %
WBC: 4.7 10*3/uL (ref 3.8–10.8)

## 2018-09-30 LAB — CORTISOL-AM, BLOOD: Cortisol - AM: 4.1 ug/dL

## 2018-09-30 LAB — TSH: TSH: 1.25 mIU/L (ref 0.40–4.50)

## 2018-09-30 LAB — T4, FREE: Free T4: 0.6 ng/dL — ABNORMAL LOW (ref 0.8–1.8)

## 2018-09-30 LAB — LUTEINIZING HORMONE: LH: 1.3 m[IU]/mL — ABNORMAL LOW (ref 1.6–15.2)

## 2018-09-30 LAB — FOLLICLE STIMULATING HORMONE: FSH: 5.3 m[IU]/mL (ref 1.6–8.0)

## 2018-10-06 ENCOUNTER — Other Ambulatory Visit: Payer: Self-pay | Admitting: "Endocrinology

## 2018-10-06 ENCOUNTER — Encounter: Payer: Self-pay | Admitting: "Endocrinology

## 2018-10-06 ENCOUNTER — Other Ambulatory Visit: Payer: Self-pay

## 2018-10-06 ENCOUNTER — Ambulatory Visit (INDEPENDENT_AMBULATORY_CARE_PROVIDER_SITE_OTHER): Payer: No Typology Code available for payment source | Admitting: "Endocrinology

## 2018-10-06 DIAGNOSIS — E039 Hypothyroidism, unspecified: Secondary | ICD-10-CM | POA: Diagnosis not present

## 2018-10-06 DIAGNOSIS — E236 Other disorders of pituitary gland: Secondary | ICD-10-CM

## 2018-10-06 MED ORDER — LEVOTHYROXINE SODIUM 75 MCG PO TABS: 75.0000 ug | ORAL_TABLET | Freq: Every day | ORAL | 3 refills | Status: DC
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
## 2018-10-24 ENCOUNTER — Telehealth: Payer: Self-pay | Admitting: "Endocrinology

## 2018-10-24 DIAGNOSIS — E039 Hypothyroidism, unspecified: Secondary | ICD-10-CM

## 2018-10-24 MED ORDER — LEVOTHYROXINE SODIUM 75 MCG PO TABS: 75.0000 ug | ORAL_TABLET | Freq: Every day | ORAL | 1 refills | Status: DC

## 2018-10-24 NOTE — Telephone Encounter (Signed)
Patient needs his synthroid medication called in the [place]. Phone number is [phone]

## 2018-10-24 NOTE — Telephone Encounter (Signed)
Rx sent 

## 2018-12-23 DIAGNOSIS — E7800 Pure hypercholesterolemia, unspecified: Secondary | ICD-10-CM | POA: Diagnosis not present

## 2018-12-23 DIAGNOSIS — Z7982 Long term (current) use of aspirin: Secondary | ICD-10-CM | POA: Diagnosis not present

## 2018-12-23 DIAGNOSIS — Z87891 Personal history of nicotine dependence: Secondary | ICD-10-CM | POA: Diagnosis not present

## 2018-12-23 DIAGNOSIS — M542 Cervicalgia: Secondary | ICD-10-CM | POA: Diagnosis not present

## 2018-12-23 DIAGNOSIS — I10 Essential (primary) hypertension: Secondary | ICD-10-CM | POA: Diagnosis not present

## 2018-12-23 DIAGNOSIS — Z79899 Other long term (current) drug therapy: Secondary | ICD-10-CM | POA: Diagnosis not present

## 2018-12-23 DIAGNOSIS — M47812 Spondylosis without myelopathy or radiculopathy, cervical region: Secondary | ICD-10-CM | POA: Diagnosis not present

## 2019-01-09 ENCOUNTER — Ambulatory Visit: Payer: Medicare Other | Admitting: "Endocrinology

## 2019-04-13 ENCOUNTER — Telehealth: Payer: Self-pay | Admitting: "Endocrinology

## 2019-04-13 DIAGNOSIS — E039 Hypothyroidism, unspecified: Secondary | ICD-10-CM

## 2019-04-13 MED ORDER — LEVOTHYROXINE SODIUM 75 MCG PO TABS: 75.0000 ug | ORAL_TABLET | Freq: Every day | ORAL | 0 refills | Status: DC

## 2019-04-13 NOTE — Telephone Encounter (Signed)
Pt made aware

## 2019-04-13 NOTE — Telephone Encounter (Signed)
Pt requesting refill on levothyroxine (SYNTHROID) 75 MCG tablet. [place]

## 2019-04-13 NOTE — Telephone Encounter (Signed)
Rx refill for levothyroxine sent to pharmacy for a 30 day supply. Pt needs to make an appointment.

## 2019-04-24 MED ORDER — LEVOTHYROXINE SODIUM 75 MCG PO TABS: 75.0000 ug | ORAL_TABLET | Freq: Every day | ORAL | 0 refills | Status: DC

## 2019-04-24 NOTE — Telephone Encounter (Signed)
Rx refill resent. 

## 2019-04-24 NOTE — Telephone Encounter (Signed)
Pt is having a problem getting his medication. Can you send again?

## 2019-04-24 NOTE — Addendum Note (Signed)
Addended by: Ellin Saba on: 04/24/2019 04:57 PM   Modules accepted: Orders

## 2019-04-27 MED ORDER — LEVOTHYROXINE SODIUM 75 MCG PO TABS: 75.0000 ug | ORAL_TABLET | Freq: Every day | ORAL | 0 refills | Status: AC

## 2019-04-27 NOTE — Addendum Note (Signed)
Addended by: Ellin Saba on: 04/27/2019 04:33 PM   Modules accepted: Orders

## 2019-04-27 NOTE — Telephone Encounter (Signed)
Faxed refill for levothyroxine.

## 2019-04-27 NOTE — Telephone Encounter (Signed)
Patient is calling again saying that we need to send this RX. I do not know why they are not getting it? He says it goes to the [place]

## 2019-08-21 DIAGNOSIS — M47816 Spondylosis without myelopathy or radiculopathy, lumbar region: Secondary | ICD-10-CM | POA: Diagnosis not present

## 2019-08-21 DIAGNOSIS — M160 Bilateral primary osteoarthritis of hip: Secondary | ICD-10-CM | POA: Diagnosis not present

## 2019-08-21 DIAGNOSIS — M545 Low back pain: Secondary | ICD-10-CM | POA: Diagnosis not present

## 2019-08-21 DIAGNOSIS — M479 Spondylosis, unspecified: Secondary | ICD-10-CM | POA: Diagnosis not present

## 2019-08-21 DIAGNOSIS — M4186 Other forms of scoliosis, lumbar region: Secondary | ICD-10-CM | POA: Diagnosis not present

## 2019-10-23 DIAGNOSIS — Z23 Encounter for immunization: Secondary | ICD-10-CM | POA: Diagnosis not present

## 2020-02-02 DIAGNOSIS — N281 Cyst of kidney, acquired: Secondary | ICD-10-CM | POA: Diagnosis not present

## 2020-02-02 DIAGNOSIS — K5730 Diverticulosis of large intestine without perforation or abscess without bleeding: Secondary | ICD-10-CM | POA: Diagnosis not present

## 2020-02-02 DIAGNOSIS — I700 Atherosclerosis of aorta: Secondary | ICD-10-CM | POA: Diagnosis not present

## 2020-02-02 DIAGNOSIS — K625 Hemorrhage of anus and rectum: Secondary | ICD-10-CM | POA: Diagnosis not present

## 2020-02-02 DIAGNOSIS — I5032 Chronic diastolic (congestive) heart failure: Secondary | ICD-10-CM | POA: Diagnosis not present

## 2020-02-02 DIAGNOSIS — N400 Enlarged prostate without lower urinary tract symptoms: Secondary | ICD-10-CM | POA: Diagnosis not present

## 2020-02-02 DIAGNOSIS — I110 Hypertensive heart disease with heart failure: Secondary | ICD-10-CM | POA: Diagnosis not present

## 2020-02-14 IMAGING — MR MR HEAD W/O CM
11 series · 48 of 48 positions shown · non-contrast
Comparison: None.

CLINICAL DATA: [place] headache

EXAM:
MRI HEAD WITHOUT CONTRAST
TECHNIQUE: Multiplanar, multiecho pulse sequences of the brain and surrounding
structures were obtained without intravenous contrast.

[Series 5001: ax dwi_tracew · axial · 3.0mm · 1.44mm/px · z∈[-26,+111]mm · 7 of 80 slices shown]
[im 1/80]
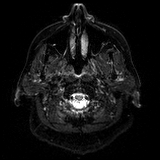
[im 14/80]
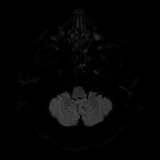
[im 27/80]
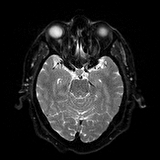
[im 40/80]
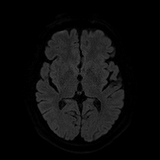
[im 53/80]
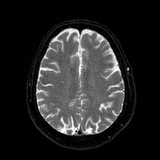
[im 66/80]
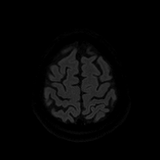
[im 80/80]
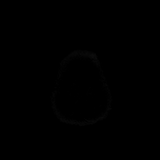

[Series 6001: ax dwi_adc · axial · 3.0mm · 1.44mm/px · z∈[-26,+111]mm · 3 of 40 slices shown]
[im 1/40]
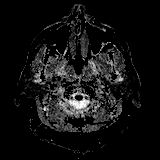
[im 20/40]
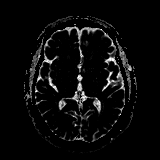
[im 40/40]
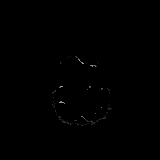

[Series 7001: T1 · sagittal · 5.0mm · 0.72mm/px · 2 of 23 slices shown]
[im 1/23]
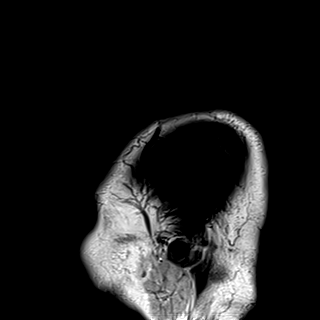
[im 23/23]
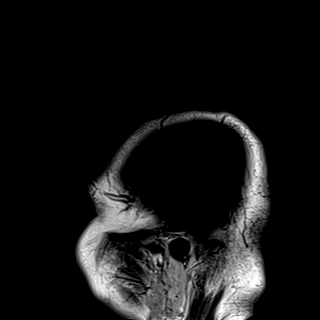

[Series 8001: cor dwi_tracew · coronal · 5.0mm · 1.44mm/px · 5 of 54 slices shown]
[im 1/54]
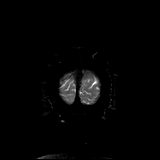
[im 14/54]
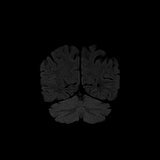
[im 27/54]
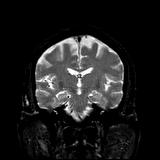
[im 40/54]
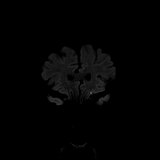
[im 54/54]
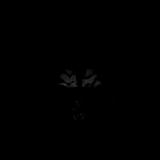

[Series 9001: cor dwi_adc · coronal · 5.0mm · 1.44mm/px · 3 of 27 slices shown]
[im 1/27]
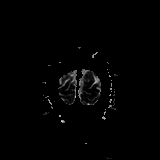
[im 14/27]
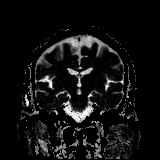
[im 27/27]
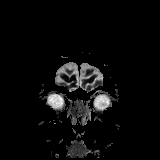

[T2 · axial · 4.0mm · 0.75mm/px · z∈[-30,+107]mm · 3 of 29 slices shown]
[im 1/29]
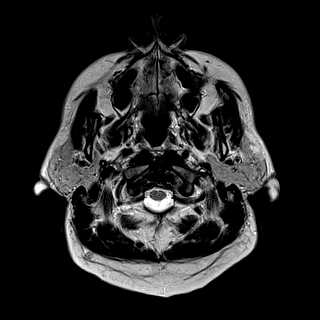
[im 15/29]
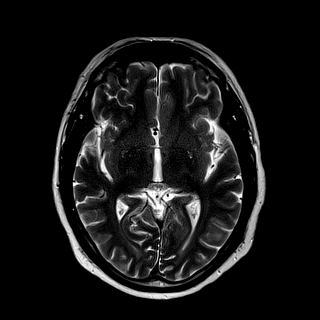
[im 29/29]
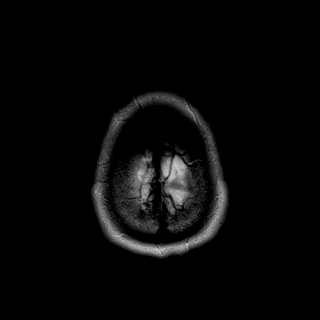

[mag_images · axial · 3.0mm · 0.94mm/px · z∈[-48,+125]mm · 6 of 60 slices shown]
[im 1/60]
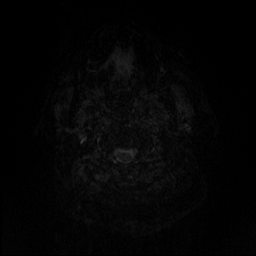
[im 12/60]
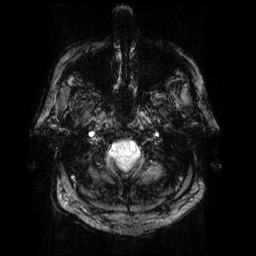
[im 24/60]
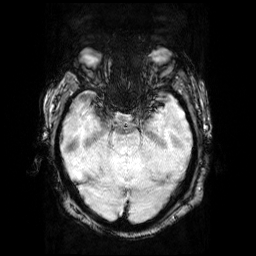
[im 36/60]
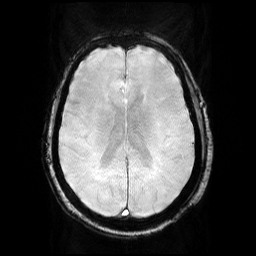
[im 48/60]
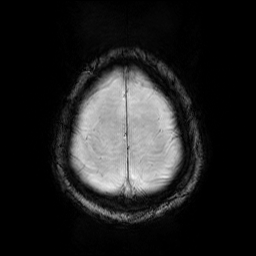
[im 60/60]
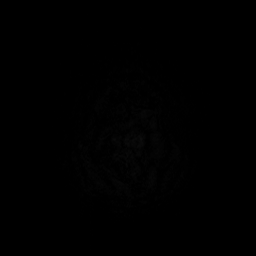

[pha_images · axial · 3.0mm · 0.94mm/px · z∈[-48,+125]mm · 6 of 60 slices shown]
[im 1/60]
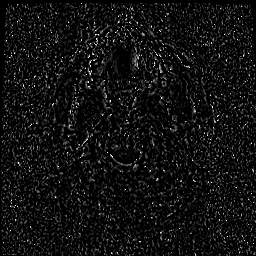
[im 12/60]
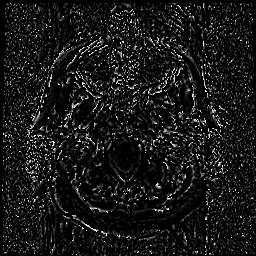
[im 24/60]
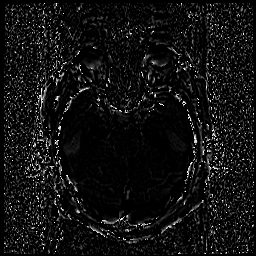
[im 36/60]
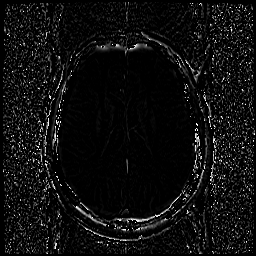
[im 48/60]
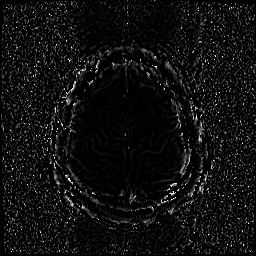
[im 60/60]
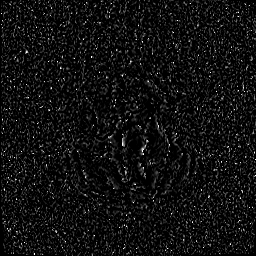

[swi_images · axial · 3.0mm · 0.94mm/px · z∈[-48,+125]mm · 6 of 60 slices shown]
[im 1/60]
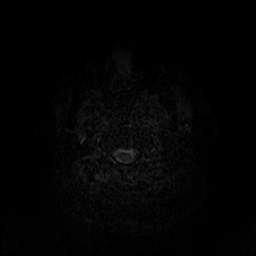
[im 12/60]
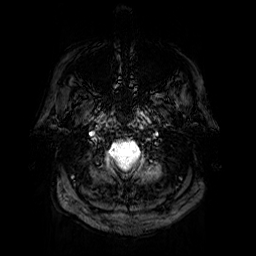
[im 24/60]
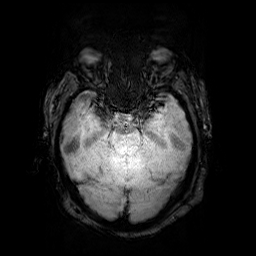
[im 36/60]
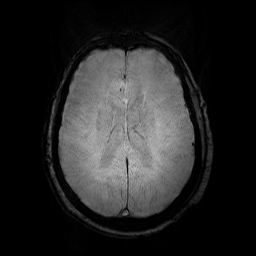
[im 48/60]
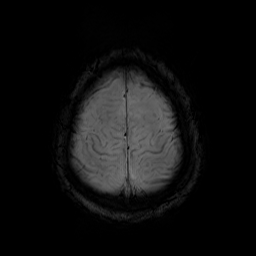
[im 60/60]
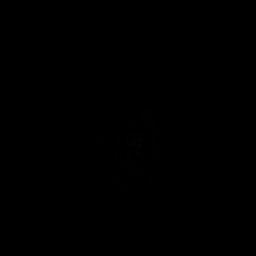

[mip_images(sw) · axial · 24.0mm · 0.94mm/px · z∈[-37,+115]mm · 5 of 53 slices shown]
[im 1/53]
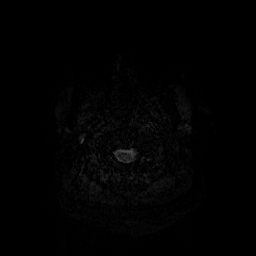
[im 14/53]
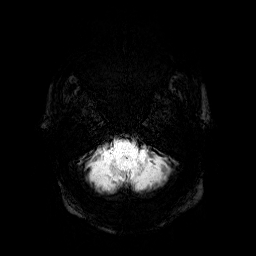
[im 27/53]
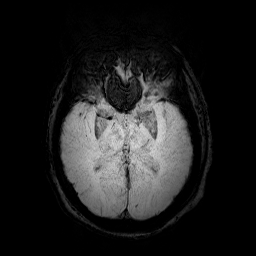
[im 40/53]
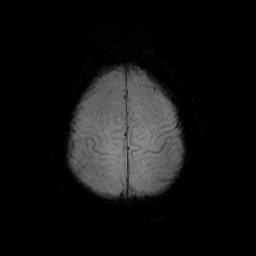
[im 53/53]
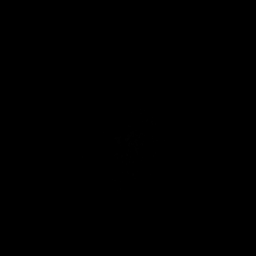

[FLAIR · axial · 3.0mm · 0.47mm/px · z∈[-35,+112]mm · 2 of 26 slices shown]
[im 1/26]
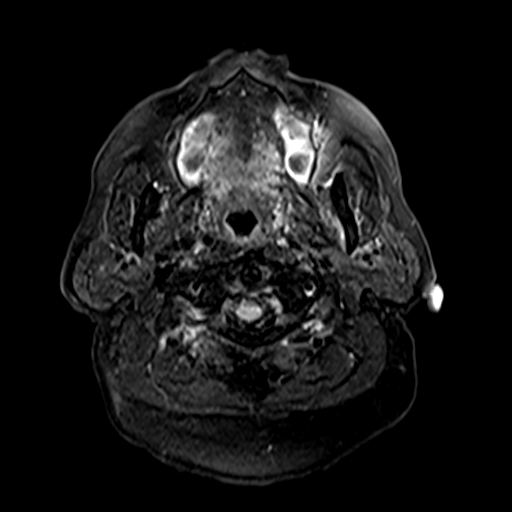
[im 26/26]
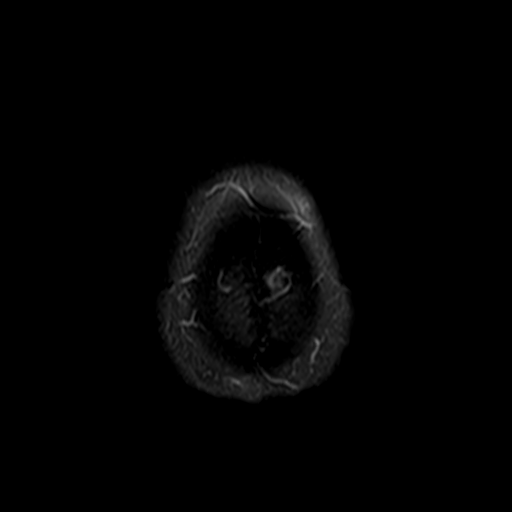

[48 of 48 positions shown; findings below may reference images not displayed]

FINDINGS: Brain: The pituitary gland is enlarged, measuring 17 x 12 mm. There
is no acute infarct or acute hemorrhage. No mass lesion,
hydrocephalus, dural abnormality or extra-axial collection. The
brain parenchymal signal is normal for the patient's age. No
age-advanced or lobar predominant atrophy. No chronic
microhemorrhage or superficial siderosis.

Vascular: Major intracranial arterial and venous sinus flow voids
are preserved.

Skull and upper cervical spine: The visualized skull base,
calvarium, upper cervical spine and extracranial soft tissues are
normal.

Sinuses/Orbits: No fluid levels or advanced mucosal thickening. No
mastoid or middle ear effusion. Normal orbits.
IMPRESSION: 1. Nonspecific enlargement of the pituitary gland. It is not clear
that this is related to the patient's headache, particularly if
there are no visual symptoms, such as bitemporal hemianopia. This
may be a macroadenoma, though lymphocytic or granulomatous
hypophysitis could also cause this appearance. Dedicated
postcontrast imaging of the pituitary gland might be helpful. No
evidence of hemorrhage.
2. Otherwise normal aging brain.

## 2020-06-10 DIAGNOSIS — Z23 Encounter for immunization: Secondary | ICD-10-CM | POA: Diagnosis not present

## 2020-10-03 DIAGNOSIS — Z7982 Long term (current) use of aspirin: Secondary | ICD-10-CM | POA: Diagnosis not present

## 2020-10-03 DIAGNOSIS — I110 Hypertensive heart disease with heart failure: Secondary | ICD-10-CM | POA: Diagnosis not present

## 2020-10-03 DIAGNOSIS — R9431 Abnormal electrocardiogram [ECG] [EKG]: Secondary | ICD-10-CM | POA: Diagnosis not present

## 2020-10-03 DIAGNOSIS — I509 Heart failure, unspecified: Secondary | ICD-10-CM | POA: Diagnosis not present

## 2020-10-03 DIAGNOSIS — R42 Dizziness and giddiness: Secondary | ICD-10-CM | POA: Diagnosis not present

## 2020-10-03 DIAGNOSIS — I10 Essential (primary) hypertension: Secondary | ICD-10-CM | POA: Diagnosis not present

## 2020-10-03 DIAGNOSIS — R001 Bradycardia, unspecified: Secondary | ICD-10-CM | POA: Diagnosis not present

## 2020-10-03 DIAGNOSIS — R911 Solitary pulmonary nodule: Secondary | ICD-10-CM | POA: Diagnosis not present

## 2020-10-03 DIAGNOSIS — H538 Other visual disturbances: Secondary | ICD-10-CM | POA: Diagnosis not present

## 2020-12-10 DIAGNOSIS — Z23 Encounter for immunization: Secondary | ICD-10-CM | POA: Diagnosis not present

## 2021-07-30 DIAGNOSIS — Z23 Encounter for immunization: Secondary | ICD-10-CM | POA: Diagnosis not present

## 2021-10-24 DIAGNOSIS — K209 Esophagitis, unspecified without bleeding: Secondary | ICD-10-CM | POA: Diagnosis not present
